# Patient Record
Sex: Male | Born: 1940 | Race: White | Hispanic: No | Marital: Married | Smoking: Former smoker
Health system: Southern US, Community
[De-identification: ages and names within clinical notes are randomized; demographics above are authoritative.]

## PROBLEM LIST (undated history)

## (undated) DIAGNOSIS — I4891 Unspecified atrial fibrillation: Secondary | ICD-10-CM

## (undated) DIAGNOSIS — M109 Gout, unspecified: Secondary | ICD-10-CM

## (undated) DIAGNOSIS — L409 Psoriasis, unspecified: Secondary | ICD-10-CM

## (undated) DIAGNOSIS — Z9989 Dependence on other enabling machines and devices: Secondary | ICD-10-CM

## (undated) DIAGNOSIS — D649 Anemia, unspecified: Secondary | ICD-10-CM

## (undated) DIAGNOSIS — N189 Chronic kidney disease, unspecified: Secondary | ICD-10-CM

## (undated) DIAGNOSIS — K56609 Unspecified intestinal obstruction, unspecified as to partial versus complete obstruction: Secondary | ICD-10-CM

## (undated) DIAGNOSIS — C189 Malignant neoplasm of colon, unspecified: Secondary | ICD-10-CM

## (undated) DIAGNOSIS — J449 Chronic obstructive pulmonary disease, unspecified: Secondary | ICD-10-CM

## (undated) DIAGNOSIS — I10 Essential (primary) hypertension: Secondary | ICD-10-CM

## (undated) DIAGNOSIS — Z9289 Personal history of other medical treatment: Secondary | ICD-10-CM

## (undated) DIAGNOSIS — C4490 Unspecified malignant neoplasm of skin, unspecified: Secondary | ICD-10-CM

## (undated) DIAGNOSIS — G4733 Obstructive sleep apnea (adult) (pediatric): Secondary | ICD-10-CM

## (undated) DIAGNOSIS — T7840XA Allergy, unspecified, initial encounter: Secondary | ICD-10-CM

## (undated) HISTORY — PX: BACK SURGERY: SHX140

## (undated) HISTORY — PX: INCISIONAL HERNIA REPAIR: SHX193

## (undated) HISTORY — PX: CATARACT EXTRACTION, BILATERAL: SHX1313

## (undated) HISTORY — PX: SHOULDER ARTHROSCOPY WITH ROTATOR CUFF REPAIR: SHX5685

## (undated) HISTORY — PX: JOINT REPLACEMENT: SHX530

## (undated) HISTORY — PX: SHOULDER ARTHROSCOPY: SHX128

## (undated) HISTORY — PX: VOCAL CORD INJECTION: SHX2663

## (undated) HISTORY — PX: HERNIA REPAIR: SHX51

## (undated) HISTORY — PX: SKIN CANCER EXCISION: SHX779

## (undated) HISTORY — PX: TONSILLECTOMY: SUR1361

## (undated) HISTORY — PX: VARICOSE VEIN SURGERY: SHX832

---

## 2001-10-01 HISTORY — PX: TOTAL HIP ARTHROPLASTY: SHX124

## 2004-07-01 HISTORY — PX: COLON SURGERY: SHX602

## 2011-10-02 HISTORY — PX: ATRIAL FIBRILLATION ABLATION: EP1191

## 2011-10-02 HISTORY — PX: ANTERIOR CERVICAL DECOMP/DISCECTOMY FUSION: SHX1161

## 2016-10-01 HISTORY — PX: MASS EXCISION: SHX2000

## 2017-04-23 ENCOUNTER — Inpatient Hospital Stay (HOSPITAL_COMMUNITY): Payer: Medicare Other

## 2017-04-23 ENCOUNTER — Emergency Department (HOSPITAL_COMMUNITY): Payer: Medicare Other

## 2017-04-23 ENCOUNTER — Inpatient Hospital Stay (HOSPITAL_COMMUNITY)
Admission: EM | Admit: 2017-04-23 | Discharge: 2017-04-25 | DRG: 390 | Disposition: A | Discharge status: Home / Self Care | Payer: Medicare Other | Attending: Internal Medicine | Admitting: Internal Medicine

## 2017-04-23 ENCOUNTER — Encounter (HOSPITAL_COMMUNITY): Payer: Self-pay | Admitting: Emergency Medicine

## 2017-04-23 DIAGNOSIS — J449 Chronic obstructive pulmonary disease, unspecified: Secondary | ICD-10-CM

## 2017-04-23 DIAGNOSIS — Z79899 Other long term (current) drug therapy: Secondary | ICD-10-CM

## 2017-04-23 DIAGNOSIS — I10 Essential (primary) hypertension: Secondary | ICD-10-CM

## 2017-04-23 DIAGNOSIS — Z85038 Personal history of other malignant neoplasm of large intestine: Secondary | ICD-10-CM

## 2017-04-23 DIAGNOSIS — Z9049 Acquired absence of other specified parts of digestive tract: Secondary | ICD-10-CM | POA: Diagnosis not present

## 2017-04-23 DIAGNOSIS — F1010 Alcohol abuse, uncomplicated: Secondary | ICD-10-CM | POA: Diagnosis present

## 2017-04-23 DIAGNOSIS — Z96642 Presence of left artificial hip joint: Secondary | ICD-10-CM | POA: Diagnosis present

## 2017-04-23 DIAGNOSIS — Z91040 Latex allergy status: Secondary | ICD-10-CM | POA: Diagnosis not present

## 2017-04-23 DIAGNOSIS — Z8042 Family history of malignant neoplasm of prostate: Secondary | ICD-10-CM

## 2017-04-23 DIAGNOSIS — I4891 Unspecified atrial fibrillation: Secondary | ICD-10-CM | POA: Diagnosis present

## 2017-04-23 DIAGNOSIS — Z825 Family history of asthma and other chronic lower respiratory diseases: Secondary | ICD-10-CM | POA: Diagnosis not present

## 2017-04-23 DIAGNOSIS — K56609 Unspecified intestinal obstruction, unspecified as to partial versus complete obstruction: Principal | ICD-10-CM | POA: Diagnosis present

## 2017-04-23 DIAGNOSIS — K56600 Partial intestinal obstruction, unspecified as to cause: Secondary | ICD-10-CM | POA: Diagnosis not present

## 2017-04-23 HISTORY — DX: Dependence on other enabling machines and devices: Z99.89

## 2017-04-23 HISTORY — DX: Unspecified atrial fibrillation: I48.91

## 2017-04-23 HISTORY — DX: Essential (primary) hypertension: I10

## 2017-04-23 HISTORY — DX: Psoriasis, unspecified: L40.9

## 2017-04-23 HISTORY — DX: Gout, unspecified: M10.9

## 2017-04-23 HISTORY — DX: Anemia, unspecified: D64.9

## 2017-04-23 HISTORY — DX: Allergy, unspecified, initial encounter: T78.40XA

## 2017-04-23 HISTORY — DX: Unspecified intestinal obstruction, unspecified as to partial versus complete obstruction: K56.609

## 2017-04-23 HISTORY — DX: Unspecified malignant neoplasm of skin, unspecified: C44.90

## 2017-04-23 HISTORY — DX: Chronic obstructive pulmonary disease, unspecified: J44.9

## 2017-04-23 HISTORY — DX: Chronic kidney disease, unspecified: N18.9

## 2017-04-23 HISTORY — DX: Malignant neoplasm of colon, unspecified: C18.9

## 2017-04-23 HISTORY — DX: Personal history of other medical treatment: Z92.89

## 2017-04-23 HISTORY — DX: Obstructive sleep apnea (adult) (pediatric): G47.33

## 2017-04-23 LAB — COMPREHENSIVE METABOLIC PANEL
ALT: 16 U/L — AB (ref 17–63)
AST: 24 U/L (ref 15–41)
Albumin: 3.9 g/dL (ref 3.5–5.0)
Alkaline Phosphatase: 83 U/L (ref 38–126)
Anion gap: 8 (ref 5–15)
BILIRUBIN TOTAL: 1.3 mg/dL — AB (ref 0.3–1.2)
BUN: 19 mg/dL (ref 6–20)
CALCIUM: 9.3 mg/dL (ref 8.9–10.3)
CO2: 27 mmol/L (ref 22–32)
CREATININE: 1.02 mg/dL (ref 0.61–1.24)
Chloride: 100 mmol/L — ABNORMAL LOW (ref 101–111)
Glucose, Bld: 121 mg/dL — ABNORMAL HIGH (ref 65–99)
Potassium: 4.6 mmol/L (ref 3.5–5.1)
Sodium: 135 mmol/L (ref 135–145)
TOTAL PROTEIN: 7 g/dL (ref 6.5–8.1)

## 2017-04-23 LAB — URINALYSIS, ROUTINE W REFLEX MICROSCOPIC
Bilirubin Urine: NEGATIVE
Glucose, UA: NEGATIVE mg/dL
Ketones, ur: 5 mg/dL — AB
LEUKOCYTES UA: NEGATIVE
NITRITE: NEGATIVE
Protein, ur: >=300 mg/dL — AB
SQUAMOUS EPITHELIAL / LPF: NONE SEEN
Specific Gravity, Urine: 1.02 (ref 1.005–1.030)
pH: 5 (ref 5.0–8.0)

## 2017-04-23 LAB — CBC
HCT: 43 % (ref 39.0–52.0)
Hemoglobin: 15.1 g/dL (ref 13.0–17.0)
MCH: 33.8 pg (ref 26.0–34.0)
MCHC: 35.1 g/dL (ref 30.0–36.0)
MCV: 96.2 fL (ref 78.0–100.0)
PLATELETS: 166 10*3/uL (ref 150–400)
RBC: 4.47 MIL/uL (ref 4.22–5.81)
RDW: 13.9 % (ref 11.5–15.5)
WBC: 6.8 10*3/uL (ref 4.0–10.5)

## 2017-04-23 LAB — LIPASE, BLOOD: Lipase: 37 U/L (ref 11–51)

## 2017-04-23 LAB — GLUCOSE, CAPILLARY: GLUCOSE-CAPILLARY: 92 mg/dL (ref 65–99)

## 2017-04-23 MED ORDER — FOLIC ACID 1 MG PO TABS
1.0000 mg | ORAL_TABLET | Freq: Every day | ORAL | Status: DC
  Administered 2017-04-25: 1 mg via ORAL
  Filled 2017-04-23: qty 1

## 2017-04-23 MED ORDER — HYDROMORPHONE HCL 1 MG/ML IJ SOLN
1.0000 mg | INTRAMUSCULAR | Status: DC | PRN
  Administered 2017-04-23 – 2017-04-24 (×2): 1 mg via INTRAVENOUS
  Filled 2017-04-23 (×2): qty 1

## 2017-04-23 MED ORDER — VITAMIN B-1 100 MG PO TABS
100.0000 mg | ORAL_TABLET | Freq: Every day | ORAL | Status: DC
  Administered 2017-04-25: 100 mg via ORAL
  Filled 2017-04-23: qty 1

## 2017-04-23 MED ORDER — ADULT MULTIVITAMIN W/MINERALS CH
1.0000 | ORAL_TABLET | Freq: Every day | ORAL | Status: DC
  Administered 2017-04-25: 1 via ORAL
  Filled 2017-04-23: qty 1

## 2017-04-23 MED ORDER — HYDROMORPHONE HCL 1 MG/ML IJ SOLN
1.0000 mg | Freq: Once | INTRAMUSCULAR | Status: AC
  Administered 2017-04-23: 1 mg via INTRAVENOUS
  Filled 2017-04-23: qty 1

## 2017-04-23 MED ORDER — ONDANSETRON HCL 4 MG/2ML IJ SOLN
4.0000 mg | Freq: Three times a day (TID) | INTRAMUSCULAR | Status: DC | PRN
  Administered 2017-04-23: 4 mg via INTRAVENOUS
  Filled 2017-04-23: qty 2

## 2017-04-23 MED ORDER — BETAMETHASONE DIPROPIONATE 0.05 % EX LOTN: 1.0000 "application " | TOPICAL_LOTION | CUTANEOUS | Status: DC

## 2017-04-23 MED ORDER — ONDANSETRON HCL 4 MG/2ML IJ SOLN
4.0000 mg | Freq: Once | INTRAMUSCULAR | Status: AC
  Administered 2017-04-23: 4 mg via INTRAVENOUS
  Filled 2017-04-23: qty 2

## 2017-04-23 MED ORDER — THIAMINE HCL 100 MG/ML IJ SOLN
100.0000 mg | Freq: Every day | INTRAMUSCULAR | Status: DC
  Administered 2017-04-23 – 2017-04-24 (×2): 100 mg via INTRAVENOUS
  Filled 2017-04-23 (×2): qty 2

## 2017-04-23 MED ORDER — SODIUM CHLORIDE 0.9 % IV BOLUS (SEPSIS)
1000.0000 mL | Freq: Once | INTRAVENOUS | Status: AC
  Administered 2017-04-23: 1000 mL via INTRAVENOUS

## 2017-04-23 MED ORDER — LORAZEPAM 1 MG PO TABS
1.0000 mg | ORAL_TABLET | Freq: Four times a day (QID) | ORAL | Status: DC | PRN
Start: 2017-04-23 — End: 2017-04-25

## 2017-04-23 MED ORDER — LIDOCAINE VISCOUS 2 % MT SOLN
15.0000 mL | Freq: Once | OROMUCOSAL | Status: AC
  Administered 2017-04-23: 15 mL via OROMUCOSAL
  Filled 2017-04-23: qty 15

## 2017-04-23 MED ORDER — ALBUTEROL SULFATE (2.5 MG/3ML) 0.083% IN NEBU: 2.5000 mg | INHALATION_SOLUTION | Freq: Four times a day (QID) | RESPIRATORY_TRACT | Status: DC | PRN

## 2017-04-23 MED ORDER — HEPARIN SODIUM (PORCINE) 5000 UNIT/ML IJ SOLN
5000.0000 [IU] | Freq: Three times a day (TID) | INTRAMUSCULAR | Status: DC
  Administered 2017-04-23 – 2017-04-25 (×5): 5000 [IU] via SUBCUTANEOUS
  Filled 2017-04-23 (×5): qty 1

## 2017-04-23 MED ORDER — DIATRIZOATE MEGLUMINE & SODIUM 66-10 % PO SOLN
90.0000 mL | Freq: Once | ORAL | Status: AC
  Administered 2017-04-24: 90 mL via NASOGASTRIC
  Filled 2017-04-23 (×2): qty 90

## 2017-04-23 MED ORDER — IOPAMIDOL (ISOVUE-300) INJECTION 61%
INTRAVENOUS | Status: AC
  Filled 2017-04-23: qty 100

## 2017-04-23 MED ORDER — NAPHAZOLINE-GLYCERIN 0.012-0.2 % OP SOLN
1.0000 [drp] | Freq: Four times a day (QID) | OPHTHALMIC | Status: DC | PRN
  Filled 2017-04-23: qty 15

## 2017-04-23 MED ORDER — LORAZEPAM 2 MG/ML IJ SOLN: 1.0000 mg | Freq: Four times a day (QID) | INTRAMUSCULAR | Status: DC | PRN

## 2017-04-23 MED ORDER — SODIUM CHLORIDE 0.9 % IV SOLN
INTRAVENOUS | Status: DC
  Administered 2017-04-23 – 2017-04-25 (×4): via INTRAVENOUS

## 2017-04-23 NOTE — ED Notes (Signed)
Pt in room resting comfortable. Denies pain at this time. NG hooked to LIS. Call bell in reach

## 2017-04-23 NOTE — ED Triage Notes (Signed)
Pt. Stated, Im having bad stomach pain and its probably because I have a small bowel obstruction.  I have scar tissue and it brings it on. The pain started around 230 this morning.

## 2017-04-23 NOTE — ED Provider Notes (Addendum)
[facility] DEPT Provider Note   CSN: [id number] Arrival date & time: 04/23/17  7616     History   Chief Complaint Chief Complaint  Patient presents with  . Abdominal Pain    HPI Seth Soto is a 76 y.o. male.  HPI Patient presents emergency department with abdominal pain that began around 2:30 this morning. Reports nausea without vomiting.  Patient has a history of multiple abdominal surgeries and history of small bowel and partial small bowel obstructions before in the past.  He reports some abdominal swelling.  He reports left lower abdominal pain.  Pain is moderate in severity.  It's coming in waves.  No urinary complaints.  No fevers or chills  Past Medical History:  Diagnosis Date  . COPD (chronic obstructive pulmonary disease) ([place])   . Hypertension   . Small bowel obstruction (HCC)     There are no active problems to display for this patient.   History reviewed. No pertinent surgical history.     Home Medications    Prior to Admission medications   Medication Sig Start Date End Date Taking? Authorizing Provider  albuterol (PROVENTIL HFA;VENTOLIN HFA) 108 (90 Base) MCG/ACT inhaler Inhale 1 puff into the lungs every 6 (six) hours as needed for wheezing or shortness of breath.   Yes [provider]  Apremilast (OTEZLA) 30 MG TABS Take 30 mg by mouth 2 (two) times daily.   Yes [provider]  betamethasone dipropionate 0.05 % lotion Apply 1 application topically 3 (three) times a week.   Yes [provider]  metoprolol tartrate (LOPRESSOR) 25 MG tablet Take 25 mg by mouth 2 (two) times daily.   Yes [provider]  tetrahydrozoline-zinc (VISINE-AC) 0.05-0.25 % ophthalmic solution Place 3-4 drops into both eyes every morning.   Yes [provider]    Family History No family history on file.  Social History Social History  Substance Use Topics  . Smoking status: Never Smoker  . Smokeless tobacco: Never Used  .  Alcohol use Yes     Allergies   Latex   Review of Systems Review of Systems  All other systems reviewed and are negative.    Physical Exam Updated Vital Signs BP (!) 145/87   Pulse 69   Temp 98 F (36.7 C)   Resp 16   Ht 5\' 11"  (1.803 m)   Wt 111.1 kg (245 lb)   SpO2 96%   BMI 34.17 kg/m   Physical Exam  Constitutional: He is oriented to person, place, and time. He appears well-developed and well-nourished.  HENT:  Head: Normocephalic and atraumatic.  Eyes: EOM are normal.  Neck: Normal range of motion.  Cardiovascular: Normal rate, regular rhythm, normal heart sounds and intact distal pulses.   Pulmonary/Chest: Effort normal and breath sounds normal. No respiratory distress.  Abdominal: Soft. He exhibits no distension.  Left-sided abdominal tenderness without guarding or rebound.  Musculoskeletal: Normal range of motion.  Neurological: He is alert and oriented to person, place, and time.  Skin: Skin is warm and dry.  Psychiatric: He has a normal mood and affect. Judgment normal.  Nursing note and vitals reviewed.    ED Treatments / Results  Labs (all labs ordered are listed, but only abnormal results are displayed) Labs Reviewed  COMPREHENSIVE METABOLIC PANEL - Abnormal; Notable for the following:       Result Value   Chloride 100 (*)    Glucose, Bld 121 (*)    ALT 16 (*)  Total Bilirubin 1.3 (*)    All other components within normal limits  URINALYSIS, ROUTINE W REFLEX MICROSCOPIC - Abnormal; Notable for the following:    Hgb urine dipstick SMALL (*)    Ketones, ur 5 (*)    Protein, ur >=300 (*)    Bacteria, UA RARE (*)    All other components within normal limits  LIPASE, BLOOD  CBC    EKG  EKG Interpretation None       Radiology Ct Abdomen Pelvis Wo Contrast  Result Date: 04/23/2017 CLINICAL DATA:  Abdominal pain. Personal history small bowel obstruction. EXAM: CT ABDOMEN AND PELVIS WITHOUT CONTRAST TECHNIQUE: Multidetector CT imaging  of the abdomen and pelvis was performed following the standard protocol without IV contrast. COMPARISON:  None. FINDINGS: Lower chest: Mild dependent atelectasis is present bilaterally. No focal nodule, mass, or airspace disease is present. Coronary artery calcifications are present. The the heart is otherwise within normal limits. No significant pleural or pericardial effusion is present. Hepatobiliary: A 1.4 cm fluid density lesion is present within the left lobe of the liver anteriorly. No other focal hepatic lesions are present. The common bile duct is within normal limits. Fluid layering in the gallbladder likely represents sludge. Pancreas: Unremarkable. No pancreatic ductal dilatation or surrounding inflammatory changes. Spleen: Normal in size without focal abnormality. 2 splenules are noted. Adrenals/Urinary Tract: Calcification is noted in the right adrenal gland. The adrenal glands are otherwise within normal limits bilaterally. A 3 cm water density exophytic lesion of the right kidney likely represents a simple cyst. A 2 cm posteromedial exophytic low-density lesion in the left kidney likely represents a simple cyst. A 9 mm indeterminate lesion is present near the upper pole of the left kidney. No stone or hydronephrosis is present in either kidney. The ureters are within normal limits. The urinary bladder is unremarkable. Stomach/Bowel: The stomach and duodenum are within normal limits, decompressed. Multiple small bowel adhesions are present. Anterior loops in the right mid and lower abdomen are mildly stenting, measured up to 3.5 cm. Fluid levels are present. No discrete transition segment is evident. Diverticular changes are present in the descending colon without focal inflammation to suggest diverticulitis. Right hemicolectomy is noted. The anastomosis is intact. There is some stranding about the small bowel. No free fluid or free air is present. Vascular/Lymphatic: Atherosclerotic calcifications  are present in the aorta without aneurysm. There is no significant adenopathy. Reproductive: Prostate is unremarkable. Other: No significant free fluid or free air is present. Musculoskeletal: Levoconvex curvature of the lumbar spine is noted. Asymmetric endplate changes are present on the right. AP alignment is anatomic. No focal lytic or blastic lesions are present. Degenerative changes are noted in the SI joints bilaterally. Left total hip arthroplasty is present. The right hip is within normal limits. IMPRESSION: 1. Distended loops of small bowel in the right mid and lower abdomen with fluid levels suggesting ileus or partial small bowel obstruction. 2. Inflammatory changes near the ileocolonic anastomosis may reflect a transition point. 3. Right hemicolectomy. 4. Inflammatory changes about both kidneys without significant stone or obstruction. 5. Bilateral exophytic renal lesions appear to be cystic. Electronically Signed   By: San Morelle M.D.   On: 04/23/2017 12:33    Procedures Procedures (including critical care time)  Medications Ordered in ED Medications  HYDROmorphone (DILAUDID) injection 1 mg (1 mg Intravenous Given 04/23/17 1153)  ondansetron (ZOFRAN) injection 4 mg (4 mg Intravenous Given 04/23/17 1153)  sodium chloride 0.9 % bolus 1,000 mL (0  mLs Intravenous Stopped 04/23/17 1257)  lidocaine (XYLOCAINE) 2 % viscous mouth solution 15 mL (15 mLs Mouth/Throat Given 04/23/17 1154)     Initial Impression / Assessment and Plan / ED Course  I have reviewed the triage vital signs and the nursing notes.  Pertinent labs & imaging results that were available during my care of the patient were reviewed by me and considered in my medical decision making (see chart for details).     Patient tolerated NG well.  CT demonstrates partial small bowel obstruction.  Patient be admitted for symptomatically management.  I suspect this will resolve on his own similar to his other episodes.   NG  TUBE placed by nursing staff  gen surg consultation Internal medicine   Final Clinical Impressions(s) / ED Diagnoses   Final diagnoses:  None    New Prescriptions New Prescriptions   No medications on file     Jola Schmidt, MD 04/23/17 [place], MD 05/09/17 [phone]

## 2017-04-23 NOTE — Consult Note (Signed)
[facility] Consult/Admission Note  Seth Soto Oct 02, 1940  [id number].    Requesting MD: Dr. Venora Maples Chief Complaint/Reason for Consult: SBO  HPI:  Patient is a 76 year old male with a history of colon cancer S/P partial colectomy complicated by sepsis and wound dehiscence, incisional hernia repair, chest mass S/P VATS, history of HTN, COPD on CPAP who presented to the [facility] ED with complaints of abdominal pain since 2 AM this morning. Patient states he woke with mild right lower quadrant abdominal pain that progressively worsened to severe pain, nonradiating, crampy in nature with associated bloating. Patient's last bowel movement was yesterday morning. Patient having flatus in the ED. Patient denies nausea, vomiting, chest pain, shortness of breath, dysuria, hematochezia, fever or chills. Patient is in town visiting his daughter from [place]. He states some of his surgeries were at [facility]. Patient states he has had a few bowel obstructions in the past that feels similar and were treated nonoperatively. Patient is not on blood thinners. Has a history of A. fib S/P ablation now in normal sinus rhythm.  Work up in the ED shows he is afebrile, BP is up some. T bilirubin 1.3, glucose 121, Cl 100, CBC is normal.   CT scan shows distended loops of bowel right and lower abdomen, suggesting ileus vs SBO.  ROS:  Review of Systems  Constitutional: Negative for chills, diaphoresis and fever.  Respiratory: Negative for shortness of breath.   Cardiovascular: Negative for chest pain.  Gastrointestinal: Positive for abdominal pain. Negative for blood in stool, constipation, diarrhea, nausea and vomiting.  Genitourinary: Negative for dysuria and hematuria.  Skin: Negative for rash.  Neurological: Negative for dizziness and loss of consciousness.  All other systems reviewed and are negative.    No family history on file.  Past Medical History:  Diagnosis Date  .  COPD (chronic obstructive pulmonary disease) ([place])   . Hypertension   . Small bowel obstruction ([place])     History reviewed. No pertinent surgical history.  Social History:  reports that he has never smoked. He has never used smokeless tobacco. He reports that he drinks alcohol. He reports that he does not use drugs.  Allergies:  Allergies  Allergen Reactions  . Latex Other (See Comments)    "peels skin off"     (Not in a hospital admission)  Blood pressure (!) 172/105, pulse 76, temperature 97.9 F (36.6 C), temperature source Oral, resp. rate 16, height _0  (1.803 m), weight 245 lb (111.1 kg), SpO2 94 %.  Physical Exam  Constitutional: He is oriented to person, place, and time and well-developed, well-nourished, and in no distress. No distress.  Well-appearing  HENT:  Head: Normocephalic and atraumatic.  Nose: Nose normal.  Eyes: Right eye exhibits no discharge. Left eye exhibits no discharge. No scleral icterus.  Pupils are equal, bilateral conjunctiva mildly injected  Neck: Normal range of motion. Neck supple. No tracheal deviation present. No thyromegaly present.  Cardiovascular: Normal rate, regular rhythm, normal heart sounds and intact distal pulses.  Exam reveals no gallop and no friction rub.   No murmur heard. Pulses:      Radial pulses are 2+ on the right side, and 2+ on the left side.       Dorsalis pedis pulses are 2+ on the right side, and 2+ on the left side.  Pulmonary/Chest: Effort normal and breath sounds normal. No respiratory distress. He has no wheezes. He has no rhonchi. He has no rales.  3 well healed previous incisions noted to right lateral chest wall patient noted was from VATS procedure  Abdominal: Soft. He exhibits distension. Bowel sounds are tinkling. There is generalized tenderness and tenderness in the right lower quadrant. There is no rigidity and no guarding. No hernia.    Large well-healed transverse scar noted to abdomen just above  the umbilicus, moderately distended, generalized TTP worse in the right lower quadrant  Musculoskeletal: Normal range of motion. He exhibits no edema, tenderness or deformity.  Lymphadenopathy:    He has no cervical adenopathy.  Neurological: He is alert and oriented to person, place, and time. No cranial nerve deficit (grossly intact). GCS score is 15.  Skin: Skin is warm and dry. No rash noted. He is not diaphoretic.  Psychiatric: Mood and affect normal.  Nursing note and vitals reviewed.   Results for orders placed or performed during the hospital encounter of 04/23/17 (from the past 48 hour(s))  Lipase, blood     Status: None   Collection Time: 04/23/17 10:02 AM  Result Value Ref Range   Lipase 37 11 - 51 U/L  Comprehensive metabolic panel     Status: Abnormal   Collection Time: 04/23/17 10:02 AM  Result Value Ref Range   Sodium 135 135 - 145 mmol/L   Potassium 4.6 3.5 - 5.1 mmol/L   Chloride 100 (L) 101 - 111 mmol/L   CO2 27 22 - 32 mmol/L   Glucose, Bld 121 (H) 65 - 99 mg/dL   BUN 19 6 - 20 mg/dL   Creatinine, Ser 1.02 0.61 - 1.24 mg/dL   Calcium 9.3 8.9 - 10.3 mg/dL   Total Protein 7.0 6.5 - 8.1 g/dL   Albumin 3.9 3.5 - 5.0 g/dL   AST 24 15 - 41 U/L   ALT 16 (L) 17 - 63 U/L   Alkaline Phosphatase 83 38 - 126 U/L   Total Bilirubin 1.3 (H) 0.3 - 1.2 mg/dL   GFR calc non Af Amer >60 >60 mL/min   GFR calc Af Amer >60 >60 mL/min    Comment: (NOTE) The eGFR has been calculated using the CKD EPI equation. This calculation has not been validated in all clinical situations. eGFR's persistently <60 mL/min signify possible Chronic Kidney Disease.    Anion gap 8 5 - 15  CBC     Status: None   Collection Time: 04/23/17 10:02 AM  Result Value Ref Range   WBC 6.8 4.0 - 10.5 K/uL   RBC 4.47 4.22 - 5.81 MIL/uL   Hemoglobin 15.1 13.0 - 17.0 g/dL   HCT 43.0 39.0 - 52.0 %   MCV 96.2 78.0 - 100.0 fL   MCH 33.8 26.0 - 34.0 pg   MCHC 35.1 30.0 - 36.0 g/dL   RDW 13.9 11.5 - 15.5 %     Platelets 166 150 - 400 K/uL  Urinalysis, Routine w reflex microscopic     Status: Abnormal   Collection Time: 04/23/17 10:02 AM  Result Value Ref Range   Color, Urine YELLOW YELLOW   APPearance CLEAR CLEAR   Specific Gravity, Urine 1.020 1.005 - 1.030   pH 5.0 5.0 - 8.0   Glucose, UA NEGATIVE NEGATIVE mg/dL   Hgb urine dipstick SMALL (A) NEGATIVE   Bilirubin Urine NEGATIVE NEGATIVE   Ketones, ur 5 (A) NEGATIVE mg/dL   Protein, ur >=300 (A) NEGATIVE mg/dL   Nitrite NEGATIVE NEGATIVE   Leukocytes, UA NEGATIVE NEGATIVE   RBC / HPF 0-5 0 - 5 RBC/hpf  WBC, UA 0-5 0 - 5 WBC/hpf   Bacteria, UA RARE (A) NONE SEEN   Squamous Epithelial / LPF NONE SEEN NONE SEEN   Mucous PRESENT    Hyaline Casts, UA PRESENT    Ct Abdomen Pelvis Wo Contrast  Result Date: 04/23/2017 CLINICAL DATA:  Abdominal pain. Personal history small bowel obstruction. EXAM: CT ABDOMEN AND PELVIS WITHOUT CONTRAST TECHNIQUE: Multidetector CT imaging of the abdomen and pelvis was performed following the standard protocol without IV contrast. COMPARISON:  None. FINDINGS: Lower chest: Mild dependent atelectasis is present bilaterally. No focal nodule, mass, or airspace disease is present. Coronary artery calcifications are present. The the heart is otherwise within normal limits. No significant pleural or pericardial effusion is present. Hepatobiliary: A 1.4 cm fluid density lesion is present within the left lobe of the liver anteriorly. No other focal hepatic lesions are present. The common bile duct is within normal limits. Fluid layering in the gallbladder likely represents sludge. Pancreas: Unremarkable. No pancreatic ductal dilatation or surrounding inflammatory changes. Spleen: Normal in size without focal abnormality. 2 splenules are noted. Adrenals/Urinary Tract: Calcification is noted in the right adrenal gland. The adrenal glands are otherwise within normal limits bilaterally. A 3 cm water density exophytic lesion of the  right kidney likely represents a simple cyst. A 2 cm posteromedial exophytic low-density lesion in the left kidney likely represents a simple cyst. A 9 mm indeterminate lesion is present near the upper pole of the left kidney. No stone or hydronephrosis is present in either kidney. The ureters are within normal limits. The urinary bladder is unremarkable. Stomach/Bowel: The stomach and duodenum are within normal limits, decompressed. Multiple small bowel adhesions are present. Anterior loops in the right mid and lower abdomen are mildly stenting, measured up to 3.5 cm. Fluid levels are present. No discrete transition segment is evident. Diverticular changes are present in the descending colon without focal inflammation to suggest diverticulitis. Right hemicolectomy is noted. The anastomosis is intact. There is some stranding about the small bowel. No free fluid or free air is present. Vascular/Lymphatic: Atherosclerotic calcifications are present in the aorta without aneurysm. There is no significant adenopathy. Reproductive: Prostate is unremarkable. Other: No significant free fluid or free air is present. Musculoskeletal: Levoconvex curvature of the lumbar spine is noted. Asymmetric endplate changes are present on the right. AP alignment is anatomic. No focal lytic or blastic lesions are present. Degenerative changes are noted in the SI joints bilaterally. Left total hip arthroplasty is present. The right hip is within normal limits. IMPRESSION: 1. Distended loops of small bowel in the right mid and lower abdomen with fluid levels suggesting ileus or partial small bowel obstruction. 2. Inflammatory changes near the ileocolonic anastomosis may reflect a transition point. 3. Right hemicolectomy. 4. Inflammatory changes about both kidneys without significant stone or obstruction. 5. Bilateral exophytic renal lesions appear to be cystic. Electronically Signed   By: San Morelle M.D.   On: 04/23/2017 12:33    Dg Abd Portable 1 View  Result Date: 04/23/2017 CLINICAL DATA:  Nasogastric tube placement, abdominal distension, small-bowel obstruction EXAM: PORTABLE ABDOMEN - 1 VIEW COMPARISON:  CT abdomen and pelvis 04/23/2017 FINDINGS: Tip of endotracheal tube projects over stomach though the proximal side-port projects over the distal esophagus, recommend advancing tube 7 cm. Lung bases clear. Visualized bowel gas pattern normal. IMPRESSION: Recommend advancing nasogastric tube 7 cm to place the proximal side-port within the stomach. Electronically Signed   By: Lavonia Dana M.D.   On: 04/23/2017  13:22      Assessment/Plan  COPD HTN History of colon cancer History of A. fib now in NSR  History of multiple abdominal surgeries including partial colectomy SBO versus ileus - Patient being admitted to medicine, appreciate their assistance with this patient - Will initiate SBO protocol and Gastrografin to be given tomorrow morning - Continue NG tube, nothing by mouth, IV fluids  We will continue to follow this patient. Thank you for the consult  Kalman Drape, [facility] Surgery 04/23/2017, 2:12 PM Pager: [phone] Consults: [phone] Mon-Fri 7:00 am-4:30 pm Sat-Sun 7:00 am-11:30 am

## 2017-04-23 NOTE — ED Notes (Signed)
X-ray at bedside

## 2017-04-23 NOTE — ED Notes (Signed)
Patient transported to CT 

## 2017-04-23 NOTE — ED Notes (Signed)
Admitting at bedside; NG tube inserted 7 cm further per xray recommendations. Pt tolerated well.

## 2017-04-23 NOTE — Progress Notes (Signed)
Pt new admit from ED for abdominal pain/ SBO partial with left NGT connected to wall intermittent suction, alert and oriented, ambulatory, no complain of abdominal pain at this time.

## 2017-04-23 NOTE — H&P (Signed)
Date: 04/23/2017               Patient Name:  Seth Soto MRN: [id number]  DOB: Sep 13, 1941 Age / Sex: 76 y.o., male   PCP: Patient, No Pcp Per         Medical Service: Internal Medicine Teaching Service         Attending Physician: Dr. Aldine Contes, MD    First Contact: Dr. Berline Lopes Pager: [phone]  Second Contact: Dr. Marlowe Sax Pager: [phone]       After Hours (After 5p/  First Contact Pager: [phone]  weekends / holidays): Second Contact Pager: [phone]   Chief Complaint: "I have a small bowel obstruction"  History of Present Illness: Seth Soto is a pleasant 76 year old male who presented to the ED with nausea, vomiting, colicky abdominal pain and one episode of loose watery stool. He was a past medical history significant for HTN, COPD, psoriasis, atrial fibrillation, colon cancer s/p surgical resection in October 4097 complicated by sepsis, multiple incisional hernia repairs, benign chest mass s/p removal, two previous small bowel obstructions (SBO) and EtOH abuse. He was accompanied by his wife and daughter who were present for the exam and contributed to the history.   He stated that beginning around 2:00 this morning he was awakened by a dull colicky abdominal pain with sufficient relief between episodes. The pain did not radiate, nor was it alleviated by position. There was progression of the pain with increasing length of the episodes, as well as alteration from dull to sharp character of the pain. In addition there was one abnormal loose watery stool the previous evening but minus blood, mucous, overly odorous or floating particles. The patient has experienced two similar episodes of SBO requiring hospitalization. The first required a nasogastric tube for decompression of a complete obstruction while the second was reported to the patient as being a partial obstruction. He believed this episode to have been reported as a partial obstruction as well. 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 Patient admitted to flatulence on two occassions but still experiencing considerably more abdominal distention than usual.   The patient denied, fever, chills, weight loss, headache, chest pain, muscle aches, constipation, long term diarrhea, shortness of breath, palpitations, urinary frequency, dysuria, hematemesis, melena, vision changes, or difficulty ambulating prior to the event.   ED placed NG tube, completed CT/Xray of the abdomin, consulted surgery, given 1 liter normal saline bolus, administered dilaudid 1mg  and ondansetron PRN. Consulted IMTS for admission and treatment of his SBO management.   Meds:  Current Meds  Medication Sig  . albuterol (PROVENTIL HFA;VENTOLIN HFA) 108 (90 Base) MCG/ACT inhaler Inhale 1 puff into the lungs every 6 (six) hours as needed for wheezing or shortness of breath.  . Apremilast (OTEZLA) 30 MG TABS Take 30 mg by mouth 2 (two) times daily.  . betamethasone dipropionate 0.05 % lotion Apply 1 application topically 3 (three) times a week.  . metoprolol tartrate (LOPRESSOR) 25 MG tablet Take 25 mg by mouth 2 (two) times daily.  Marland Kitchen tetrahydrozoline-zinc (VISINE-AC) 0.05-0.25 % ophthalmic solution Place 3-4 drops into both eyes every morning.     Allergies: Allergies as of 04/23/2017 - Review Complete 04/23/2017  Allergen Reaction Noted  . Latex Other (See Comments) 04/23/2017   Past Medical History:  Diagnosis Date  . Allergy    Latex and surgical tape?  Marland Kitchen  Atrial fibrillation ([place])   . COPD (chronic obstructive pulmonary disease) ([place])   . Hypertension   . Small bowel obstruction ([place])     Family History:  Mother, emphysema Father, prostate cancer  Social History:  Married Works as a Forensic psychologist One daughter and a son Smoker, denied EtOH, >3 cocktails per night Denied illegal drugs   Review of Systems: A complete ROS was  negative except as per HPI.   Physical Exam: Blood pressure (!) 172/105, pulse 76, temperature 97.9 F (36.6 C), temperature source Oral, resp. rate 16, height 5\' 11"  (1.803 m), weight 245 lb (111.1 kg), SpO2 94 %. Physical Exam  Constitutional: He is oriented to person, place, and time. He appears well-developed and well-nourished. No distress.  HENT:  Head: Normocephalic and atraumatic.  Eyes: EOM are normal. No scleral icterus.  Conjunctivae mildly erythematous   Neck: Normal range of motion. Neck supple. No tracheal deviation present. No thyromegaly present.  Cardiovascular: Normal rate and regular rhythm.   No murmur heard. Pulmonary/Chest: Effort normal and breath sounds normal. No respiratory distress. He has no wheezes.  Abdominal: Bowel sounds are normal. He exhibits distension. He exhibits no mass. There is tenderness. There is no rebound and no guarding.  Musculoskeletal: He exhibits no edema or deformity.  Neurological: He is alert and oriented to person, place, and time.  Skin: Skin is warm and dry. Capillary refill takes less than 2 seconds. He is not diaphoretic.  Psychiatric: He has a normal mood and affect. His behavior is normal. Judgment and thought content normal.   EKG: n/a  Abd-XR/CT:  Mildy distend loops of small bowel, NG tube visible, no free air  Assessment & Plan by Problem: Active Problems:   SBO (small bowel obstruction) (HCC)   COPD (chronic obstructive pulmonary disease) (HCC)   Essential hypertension   Atrial fibrillation (HCC)  Small bowel obstruction: Patient presented with sudden onset colicky abdominal pain with tenderness in the RLQ near the umbilicus. He has a history significant for two prior SBO's with imaging also consistent with possible SBO minus other prominent contributory process. The patient is most likely suffering from SBO s/p abdominal surgery, with a differential including pancreatitis, mesenteric ischemia, strangulated loop of bowel  s/p hernia,  Gastritis. Will admit for further evaluation and treatment as probable SBO at this time. -Surgery consulted to follow ordered SBO protocol and gastrografin in am -Lipase 37 -CMP unremarkable with potassium 4.6, bili 1.3, AST 24/ALT 16 -CBC unremarkable -UA demonstrated >300 protein, ketones, small Hgb -CT demonstrated possible SBO with distended loops of bowel, inflammation near the ileocolonic anastomosis, right hemicolectomy, exophytic renal lesions with parenchymal inflammation of the kidneys minus stones -NG tube placed for decompression.  -Dilaudid 1mg  q4prn for pain -zofran 4mg  q8prn for nauseas/vomiting  COPD: -Albuterol inhaler 1 puff q6 -Patients SpO2 94% denying shortness of breath -Denied sputum production, respiratory distress -Will continue to monitor -Please notify physician if SpO2 drops to 88% on room air or if patient requires O2 supplementation  Essential Hypertension: -will continue to monitor, if pressures consistently greater than 657 systolic page MD -currently holding home dose of metoprolol 25mg  BID  Atrial fibrillation -s/p ablation without known reoccurrance -notify physician if patient develops lightheadedness, palpitations or similar concerns  EtOH abuse: -CIWA protocol with ativan 1mg  ordered  Diet: NPO DVT ppx: heparin injection 5000 units subcutaneous q8hrs Fluids: 125cc/hr CODE: full Dispo: Admit patient to Inpatient with expected length of stay greater than 2 midnights.  Signed: Kathi Ludwig,  MD 04/23/2017, 2:41 PM  Pager: Pager# [phone]

## 2017-04-23 NOTE — ED Notes (Signed)
Pharm called for gastrografin

## 2017-04-24 ENCOUNTER — Inpatient Hospital Stay (HOSPITAL_COMMUNITY): Payer: Medicare Other

## 2017-04-24 DIAGNOSIS — I10 Essential (primary) hypertension: Secondary | ICD-10-CM

## 2017-04-24 DIAGNOSIS — F1010 Alcohol abuse, uncomplicated: Secondary | ICD-10-CM

## 2017-04-24 DIAGNOSIS — K56600 Partial intestinal obstruction, unspecified as to cause: Secondary | ICD-10-CM

## 2017-04-24 DIAGNOSIS — J449 Chronic obstructive pulmonary disease, unspecified: Secondary | ICD-10-CM

## 2017-04-24 DIAGNOSIS — Z91040 Latex allergy status: Secondary | ICD-10-CM

## 2017-04-24 LAB — BASIC METABOLIC PANEL
ANION GAP: 10 (ref 5–15)
BUN: 15 mg/dL (ref 6–20)
CO2: 24 mmol/L (ref 22–32)
Calcium: 8.6 mg/dL — ABNORMAL LOW (ref 8.9–10.3)
Chloride: 103 mmol/L (ref 101–111)
Creatinine, Ser: 1.01 mg/dL (ref 0.61–1.24)
GFR calc non Af Amer: >60 mL/min (ref >60)
GLUCOSE: 99 mg/dL (ref 65–99)
POTASSIUM: 4.4 mmol/L (ref 3.5–5.1)
Sodium: 137 mmol/L (ref 135–145)

## 2017-04-24 LAB — CBC
HEMATOCRIT: 43.2 % (ref 39.0–52.0)
HEMOGLOBIN: 14.4 g/dL (ref 13.0–17.0)
MCH: 32.8 pg (ref 26.0–34.0)
MCHC: 33.3 g/dL (ref 30.0–36.0)
MCV: 98.4 fL (ref 78.0–100.0)
Platelets: 161 10*3/uL (ref 150–400)
RBC: 4.39 MIL/uL (ref 4.22–5.81)
RDW: 14.2 % (ref 11.5–15.5)
WBC: 8.3 10*3/uL (ref 4.0–10.5)

## 2017-04-24 LAB — GLUCOSE, CAPILLARY
GLUCOSE-CAPILLARY: 95 mg/dL (ref 65–99)
GLUCOSE-CAPILLARY: 86 mg/dL (ref 65–99)
GLUCOSE-CAPILLARY: 114 mg/dL — AB (ref 65–99)
Glucose-Capillary: 88 mg/dL (ref 65–99)

## 2017-04-24 MED ORDER — METOPROLOL TARTRATE 25 MG PO TABS
25.0000 mg | ORAL_TABLET | Freq: Two times a day (BID) | ORAL | Status: DC
  Administered 2017-04-24 – 2017-04-25 (×3): 25 mg via ORAL
  Filled 2017-04-24 (×3): qty 1

## 2017-04-24 MED ORDER — ORAL CARE MOUTH RINSE
15.0000 mL | Freq: Two times a day (BID) | OROMUCOSAL | Status: DC
  Administered 2017-04-24 – 2017-04-25 (×3): 15 mL via OROMUCOSAL

## 2017-04-24 NOTE — Progress Notes (Signed)
NG TUBE REMOVED PER MD. PT TOLERATED IT WELL.

## 2017-04-24 NOTE — Progress Notes (Signed)
   Subjective: [name] was feeling great today. He stated that he slept rather well considering the NG tube, had two large bowel movements and continued to pass gas with reduced abdominal distention and pain. He denied nausea, vomiting, diarhea, constipation, chest pain, muscle aches, fever or chills.   Objective:  Vital signs in last 24 hours: Vitals:   04/23/17 1739 04/23/17 1744 04/23/17 2142 04/24/17 0528  BP: (!) 169/86 (!) 151/84 (!) 126/95 (!) 152/66  Pulse: 81 76 96 100  Resp: 16  18 18   Temp: 98 F (36.7 C) 98.4 F (36.9 C) 99.1 F (37.3 C) 99.3 F (37.4 C)  TempSrc:  Oral Oral Oral  SpO2: 97% 96% 98% 91%  Weight: 238 lb 4.8 oz (108.1 kg)     Height: 5\' 11"  (1.803 m)      Complete ROS negative except as per subjective. Physical Exam  Constitutional: He is oriented to person, place, and time. He appears well-developed and well-nourished. No distress.  HENT:  Head: Normocephalic and atraumatic.  Eyes: Right eye exhibits no discharge. Left eye exhibits no discharge. No scleral icterus.  Cardiovascular: Normal rate and regular rhythm.   Pulmonary/Chest: Effort normal and breath sounds normal. No respiratory distress. He has no wheezes.  Abdominal: Soft. Bowel sounds are normal. He exhibits distension. He exhibits no mass. There is no tenderness. There is no rebound and no guarding. No hernia.  Musculoskeletal: He exhibits no edema, tenderness or deformity.  Neurological: He is alert and oriented to person, place, and time.  Skin: Skin is warm and dry. Capillary refill takes 2 to 3 seconds. He is not diaphoretic.  Psychiatric: He has a normal mood and affect. His behavior is normal. Judgment and thought content normal.     Assessment/Plan:  Active Problems:   SBO (small bowel obstruction) (HCC)   COPD (chronic obstructive pulmonary disease) (HCC)   Essential hypertension   Atrial fibrillation (HCC)  SBO: -surgery on board, evaluating with gastrografin being given  this AM, scan at 1400. -Pain control with dilaudid 1mg  q4 PRN, one does at 0100 -NG tube for decompression in place, will evaluate again for continued need -labs are unremarkable   -Will await surgery recommendations   COPD: -Albuterol inhaler 1 puff q6 -Will continue to monitor -Please notify physician if SpO2 drops to 88% on room air or if patient requires O2 supplementation  Essential Hypertension: -will continue to monitor, if pressures consistently greater than 779 systolic page MD -currently holding home dose of metoprolol 25mg  BID  Atrial fibrillation -s/p ablation without known reoccurrance -notify physician if patient develops lightheadedness, palpitations or similar concerns  EtOH abuse: -CIWA protocol with ativan 1mg  ordered  Diet: NPO DVT ppx: heparin injection 5000 units subcutaneous q8hrs Fluids: 125cc/hr CODE: full Dispo: Anticipated discharge in approximately 0-1 day(s).   Kathi Ludwig, MD 04/24/2017, 11:14 AM Pager: Pager# [phone]

## 2017-04-24 NOTE — Progress Notes (Signed)
    CC:  SBO  Subjective: He feels better this AM some flatus and 2 BM, abdomen still feels pretty distended. SB protocol is in progress  Objective: Vital signs in last 24 hours: Temp:  [97.9 F (36.6 C)-99.3 F (37.4 C)] 99.3 F (37.4 C) (07/25 0528) Pulse Rate:  [63-100] 100 (07/25 0528) Resp:  [16-18] 18 (07/25 0528) BP: (126-172)/(66-105) 152/66 (07/25 0528) SpO2:  [89 %-98 %] 91 % (07/25 0528) Weight:  [108.1 kg (238 lb 4.8 oz)-111.1 kg (245 lb)] 108.1 kg (238 lb 4.8 oz) (07/24 1739) Last BM Date: 04/22/17 NPO 1127 IV 355 urine 900 from NG Afebrile   Intake/Output from previous day: 07/24 0701 - 07/25 0700 In: 1127.1 [I.V.:1127.1] Out: 5790 [Urine:355; Emesis/NG output:900] Intake/Output this shift: No intake/output data recorded.  General appearance: alert, cooperative and no distress Resp: clear to auscultation bilaterally GI: large abdomen , still distended, few hyperactive BS, + flatus/BM x 2   Lab Results:   Recent Labs  04/23/17 1002 04/24/17 0445  WBC 6.8 8.3  HGB 15.1 14.4  HCT 43.0 43.2  PLT 166 161    BMET  Recent Labs  04/23/17 1002 04/24/17 0445  NA 135 137  K 4.6 4.4  CL 100* 103  CO2 27 24  GLUCOSE 121* 99  BUN 19 15  CREATININE 1.02 1.01  CALCIUM 9.3 8.6*   PT/INR No results for input(s): LABPROT, INR in the last 72 hours.   Recent Labs Lab 04/23/17 1002  AST 24  ALT 16*  ALKPHOS 83  BILITOT 1.3*  PROT 7.0  ALBUMIN 3.9     Lipase     Component Value Date/Time   LIPASE 37 04/23/2017 1002     Medications: . folic acid  1 mg Oral Daily  . heparin  5,000 Units Subcutaneous Q8H  . mouth rinse  15 mL Mouth Rinse BID  . multivitamin with minerals  1 tablet Oral Daily  . thiamine  100 mg Oral Daily   Or  . thiamine  100 mg Intravenous Daily   Anti-infectives    None      Assessment/Plan Recurret SBO vs ileus Multiple abdominal surgeries COPD HTN History of colon cancer History of A. fib now in  NSR FEN:  IV fluids/NPO ID:  None DVT:  SCD/heparin   Plan:  Small Bowel protocol, he got the contrast 0524 this AM.  Film around 2 PM.    LOS: 1 day    Seth Soto 04/24/2017 [phone]

## 2017-04-24 NOTE — Progress Notes (Signed)
  Date: 04/24/2017  Patient name: Deago Burruss  Medical record number: [id number]  Date of birth: 15-Jan-1941   I have seen and evaluated Remo Lipps Reiswig and discussed their care with the Residency Team. In brief, patient is a 76 year old male with a past medical history of hypertension, COPD, atrial fibrillation, colon cancer status post surgical resection complicated by multiple incisional hernia repairs and 2 previous small bowel obstructions who presented to the ED with sudden onset abdominal pain 1 day.  Yesterday morning patient was woken up from sleep with diffuse abdominal pain which is colicky in nature, nonradiating with no aggravating or relieving factors. Patient states that pain progressively increased in intensity and duration of episodes and became sharp in nature. Patient also complained of one episode of diarrhea earlier in the evening. No melena, no hematochezia. Patient came to the ED for further follow-up as this pain was similar to his previous episodes of SBO. Since admission, patient has had 2 bowel movements and passes gas. He still complains of some mild abdominal distention. No fevers or chills, no chest pain, no nausea or vomiting, no lightheadedness or syncope, no headache, no focal weakness, no chest pain, shortness of breath, no palpitations.  Patient feels well today with no abdominal pain but would like to go home when possible.  PMHx, Fam Hx, and/or Soc Hx : As per resident admit note  Vitals:   04/23/17 2142 04/24/17 0528  BP: (!) 126/95 (!) 152/66  Pulse: 96 100  Resp: 18 18  Temp: 99.1 F (37.3 C) 99.3 F (37.4 C)   Gen.: Awake alert and oriented 3, NAD CVS: Regular rate and rhythm, normal heart sounds Lungs: CTA bilaterally Abdomen: Soft, distended, normoactive bowel sounds, nontender Extremities: No edema noted  Assessment and Plan: I have seen and evaluated the patient as outlined above. I agree with the formulated Assessment and Plan as detailed in the  residents' note, with the following changes:   1. 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 Since admission patient has had 2 bowel movements and continues to pass gas and states his symptoms have resolved. - Surgery follow-up and recommendations appreciated - Patient had an NG tube placed in the ED which has put out approximately 900 mL. He is scheduled for small bowel for occult today and had Gastrografin already. He expects to get his abdominal film around 2 PM. We will follow-up these results. - Likely advance his diet today depending on results of his small bowel protocol. If he continues to do well and tolerates an oral diet he will likely be discharged home later today. - No further workup for now   Aldine Contes, MD 7/25/201812:30 PM

## 2017-04-24 NOTE — Progress Notes (Signed)
Film this afternoon shows contrast in the colon:   Contrast is now noted within the colon and rectum. No dilated bowel loops are identified on this study. An NG tube is present with tip overlying the mid stomach. Left hip arthroplasty changes noted.  Will d/c NG and start clears.

## 2017-04-24 NOTE — Plan of Care (Signed)
Problem: Safety: Goal: Ability to remain free from injury will improve Outcome: Progressing Pt will be free from falls and injuries during this hospitalization.  Problem: Bowel/Gastric: Goal: Will not experience complications related to bowel motility Outcome: Progressing Pt's signs and symptoms of small bowel obstruction will resolve prior to discharge.

## 2017-04-25 LAB — GLUCOSE, CAPILLARY
GLUCOSE-CAPILLARY: 84 mg/dL (ref 65–99)
Glucose-Capillary: 112 mg/dL — ABNORMAL HIGH (ref 65–99)

## 2017-04-25 MED ORDER — ONDANSETRON 4 MG PO TBDP: 4.0000 mg | ORAL_TABLET | Freq: Three times a day (TID) | ORAL | Status: AC | PRN

## 2017-04-25 NOTE — Progress Notes (Signed)
    CC:   SBO  Subjective: He is much better, on full liquids now and plans for soft diet later today.  Home later today if he does well.   Objective: Vital signs in last 24 hours: Temp:  [98.5 F (36.9 C)-99.4 F (37.4 C)] 99.3 F (37.4 C) (07/26 0512) Pulse Rate:  [74-102] 83 (07/26 0512) Resp:  [16-17] 17 (07/26 0512) BP: (135-178)/(71-91) 143/71 (07/26 0512) SpO2:  [90 %-92 %] 91 % (07/26 0512) Last BM Date: 04/24/17 270 PO 2300 IV Voided x 4 NG 400 - removed last PM BM x 6 Intake/Output from previous day: 07/25 0701 - 07/26 0700 In: 2580.4 [P.O.:270; I.V.:2310.4] Out: 400 [Emesis/NG output:400] Intake/Output this shift: No intake/output data recorded.  General appearance: alert, cooperative and no distress  Large abdomen, up eating second breakfast, + BS and BM  Lab Results:   Recent Labs  04/23/17 1002 04/24/17 0445  WBC 6.8 8.3  HGB 15.1 14.4  HCT 43.0 43.2  PLT 166 161    BMET  Recent Labs  04/23/17 1002 04/24/17 0445  NA 135 137  K 4.6 4.4  CL 100* 103  CO2 27 24  GLUCOSE 121* 99  BUN 19 15  CREATININE 1.02 1.01  CALCIUM 9.3 8.6*   PT/INR No results for input(s): LABPROT, INR in the last 72 hours.   Recent Labs Lab 04/23/17 1002  AST 24  ALT 16*  ALKPHOS 83  BILITOT 1.3*  PROT 7.0  ALBUMIN 3.9     Lipase     Component Value Date/Time   LIPASE 37 04/23/2017 1002     Medications: . folic acid  1 mg Oral Daily  . heparin  5,000 Units Subcutaneous Q8H  . mouth rinse  15 mL Mouth Rinse BID  . metoprolol tartrate  25 mg Oral BID  . multivitamin with minerals  1 tablet Oral Daily  . thiamine  100 mg Oral Daily   Or  . thiamine  100 mg Intravenous Daily    Assessment/Plan Recurret SBO vs ileus Multiple abdominal surgeries COPD HTN History of colon cancer History of A. fib now in NSR FEN:  IV fluids/NPO ID:  None DVT:  SCD/heparin   Plan:  Advance diet and home when tolerating soft diet.    LOS: 2 days     Dylon Correa 04/25/2017 [phone]

## 2017-04-25 NOTE — Discharge Instructions (Signed)
Please include a copy of the discharge summary with the patient's discharge paperwork so that he may distribute a copy to his PCP.    Small Bowel Obstruction A small bowel obstruction means that something is blocking the small bowel. The small bowel is also called the small intestine. It is the long tube that connects the stomach to the colon. An obstruction will stop food and fluids from passing through the small bowel. Treatment depends on what is causing the problem and how bad the problem is. Follow these instructions at home:  Get a lot of rest.  Follow your diet as told by your doctor. You may need to: ? Only drink clear liquids until you start to get better. ? Avoid solid foods as told by your doctor.  Take over-the-counter and prescription medicines only as told by your doctor.  Keep all follow-up visits as told by your doctor. This is important. Contact a doctor if:  You have a fever.  You have chills. Get help right away if:  You have pain or cramps that get worse.  You throw up (vomit) blood.  You have a feeling of being sick to your stomach (nausea) that does not go away.  You cannot stop throwing up.  You cannot drink fluids.  You feel confused.  You feel dry or thirsty (dehydrated).  Your belly gets more bloated.  You feel weak or you pass out (faint). This information is not intended to replace advice given to you by your health care provider. Make sure you discuss any questions you have with your health care provider. Document Released: 10/25/2004 Document Revised: 05/14/2016 Document Reviewed: 11/11/2014 Elsevier Interactive Patient Education  [employer].

## 2017-04-25 NOTE — Progress Notes (Signed)
Patient discharged with wife at bedside.  Upon removing IV saline lock  After applying gauze to site, bleeding occurred with  Ecchymosis. Pressure applied for 5 mins with clean gauze and bleeding stopped.  Ecchymosis area  marked with green marker and extra gauze and tape given to wife. Wife verberalized understanding  How to apply tape if needed.

## 2017-04-25 NOTE — Discharge Summary (Signed)
Name: Seth Soto MRN: [id number] DOB: 06/05/1941 76 y.o. PCP: System, Pcp Not In  Date of Admission: 04/23/2017 10:04 AM Date of Discharge: 04/25/2017 Attending Physician: Aldine Contes, MD  Discharge Diagnosis: Active Problems:   SBO (small bowel obstruction) (HCC)   COPD (chronic obstructive pulmonary disease) ([place])   Essential hypertension   Atrial fibrillation [facility])  Discharge Medications: Allergies as of 04/25/2017      Reactions   Latex Other (See Comments)   "peels skin off"      Medication List    TAKE these medications   albuterol 108 (90 Base) MCG/ACT inhaler Commonly known as:  PROVENTIL HFA;VENTOLIN HFA Inhale 1 puff into the lungs every 6 (six) hours as needed for wheezing or shortness of breath.   betamethasone dipropionate 0.05 % lotion Apply 1 application topically 3 (three) times a week.   metoprolol tartrate 25 MG tablet Commonly known as:  LOPRESSOR Take 25 mg by mouth 2 (two) times daily.   OTEZLA 30 MG Tabs Generic drug:  Apremilast Take 30 mg by mouth 2 (two) times daily.   tetrahydrozoline-zinc 0.05-0.25 % ophthalmic solution Commonly known as:  VISINE-AC Place 3-4 drops into both eyes every morning.       Disposition and follow-up:   Mr.Seth Soto was discharged from [facility] in Good condition.  At the hospital follow up visit please address:  1.  Abdominal pain and regular bowel movements, discuss alcohol reduction, dietary guidelines for reducing the incidence of SBO.  2.  Labs / imaging needed at time of follow-up: n/a  3.  Pending labs/ test needing follow-up: n/a  Follow-up Appointments:   Hospital Course by problem list: Active Problems:   SBO (small bowel obstruction) (HCC)   COPD (chronic obstructive pulmonary disease) (HCC)   Essential hypertension   Atrial fibrillation (HCC)   Small bowel obstruction:  [name] is a pleasant 76 year old male who presented to the ED with sudden onset dull  colicky abdominal pain that initiated around 0200 and progressed throughout the morning to sharp stabbing severe pain prompting his desire to seek assistance at the local ED. [place] as such previous medical records were not available in great quantity. We ascertained that he had a previous medical history significant for COPD, A-fib s/p ablation, colon cancer s/p removal accompanied by septic shock and dialysis for 6 months in 2005. He underwent incisional hernia repair and multiple revisions of that repair as well. Upon presentation he informed the staff that he was having a small bowel obstruction as he had twice had before.  A nasogastric tube(NG) was placed for decompression, he was made nothing per os(NPO), with his pain controled by dilaudid which he required only once each day of his star. He experienced a bowel movement the night of his stay as well as notable flatulence. A gastrografin study was initiated the second day of admission which determined that there was not obvious sign of an obstruction. As such the patient was progressed to clear liquids that evening and the NG tube was removed. The following morning he was progressed to a liquid diet and tolerated that well. During the noon meal he was permitted soft foods and discharged home after tolerating those as well. He is advised to follow-up with his primary care doctor (PCP) as an outpatient for dietary guidance surrounding small bowel obstruction.   A-fib There was no reoccurrence of his atrial fibrillation during his stay. He remained free  of palpitations and lightheadedness.   HTN: Patients blood pressures where mildly elevated during his stay. Following his promotion to permissive PO intake he was restarted on metoprolol which surprisingly reduced his BP to 140's/90's. He remained in that range until discharge.   COPD: Patient remained free of SOB, cough did not worsen and he was treated with his  home dose of albuterol.  Prophylaxis of potential deep vein thrombosis was provided with heparin.   Discharge Vitals:   BP (!) 160/80 (BP Location: Left Arm)   Pulse 83   Temp 99.8 F (37.7 C) (Oral)   Resp 18   Ht 5\' 11"  (1.803 m)   Wt 238 lb 4.8 oz (108.1 kg)   SpO2 94%   BMI 33.24 kg/m   Pertinent Labs, Studies, and Procedures:  CMP Latest Ref Rng & Units 04/24/2017 04/23/2017  Glucose 65 - 99 mg/dL 99 121(H)  BUN 6 - 20 mg/dL 15 19  Creatinine 0.61 - 1.24 mg/dL 1.01 1.02  Sodium 135 - 145 mmol/L 137 135  Potassium 3.5 - 5.1 mmol/L 4.4 4.6  Chloride 101 - 111 mmol/L 103 100(L)  CO2 22 - 32 mmol/L 24 27  Calcium 8.9 - 10.3 mg/dL 8.6(L) 9.3  Total Protein 6.5 - 8.1 g/dL - 7.0  Total Bilirubin 0.3 - 1.2 mg/dL - 1.3(H)  Alkaline Phos 38 - 126 U/L - 83  AST 15 - 41 U/L - 24  ALT 17 - 63 U/L - 16(L)   CBC Latest Ref Rng & Units 04/24/2017 04/23/2017  WBC 4.0 - 10.5 K/uL 8.3 6.8  Hemoglobin 13.0 - 17.0 g/dL 14.4 15.1  Hematocrit 39.0 - 52.0 % 43.2 43.0  Platelets 150 - 400 K/uL 161 166   Abdominal X-ray with gastrografin: PORTABLE ABDOMEN - 1 VIEW COMPARISON:  04/23/2017 abdominal CT FINDINGS: Contrast is now noted within the colon and rectum. No dilated bowel loops are identified on this study. An NG tube is present with tip overlying the mid stomach. Left hip arthroplasty changes noted. IMPRESSION: Contrast present within the colon and rectum -no evidence of high-grade bowel obstruction. NG tube with tip overlying the mid stomach.  Discharge Instructions: Discharge Instructions    Diet - low sodium heart healthy    Complete by:  As directed    Increase activity slowly    Complete by:  As directed      Please include a copy of the discharge summary with the patient discharge paperwork so that he may distribute a copy to his PCP.   SignedIna Homes, MD 04/25/2017, 3:30 PM   Pager# [phone]

## 2017-04-25 NOTE — Progress Notes (Signed)
   Subjective: Seth Soto was sitting up in bed eating grits and ice cream upon entering the room. He stated that he felt well, had over 7 bowel movements and was anxious to go home. He denied chest pain, nausea, vomiting, diarrhea, constipation, headache, visual disturbances or alteration in his mentation. He was in agreement with going home today.   Objective:  Vital signs in last 24 hours: Vitals:   04/24/17 0528 04/24/17 1300 04/24/17 2204 04/25/17 0512  BP: (!) 152/66 (!) 178/91 135/76 (!) 143/71  Pulse: 100 (!) 102 74 83  Resp: 18  16 17   Temp: 99.3 F (37.4 C) 99.4 F (37.4 C) 98.5 F (36.9 C) 99.3 F (37.4 C)  TempSrc: Oral Oral Oral Oral  SpO2: 91% 92% 90% 91%  Weight:      Height:       Complete ROS negative except as per subjective.   Physical Exam  Constitutional: He is oriented to person, place, and time. He appears well-developed and well-nourished. No distress.  HENT:  Head: Normocephalic and atraumatic.  Eyes: EOM are normal. Right eye exhibits no discharge. Left eye exhibits no discharge. No scleral icterus.  Neck: Normal range of motion. Neck supple.  Cardiovascular: Normal rate, regular rhythm and normal heart sounds.   No murmur heard. Pulmonary/Chest: Effort normal and breath sounds normal. No respiratory distress. He has no wheezes.  Abdominal: Soft. Bowel sounds are normal. He exhibits distension. He exhibits no mass. There is no tenderness. There is no rebound and no guarding.  Musculoskeletal: Normal range of motion. He exhibits no edema or tenderness.  Neurological: He is alert and oriented to person, place, and time.  Skin: Skin is warm and dry. Capillary refill takes less than 2 seconds. He is not diaphoretic.  Psychiatric: He has a normal mood and affect. His behavior is normal. Thought content normal.   Assessment/Plan:  Active Problems:   SBO (small bowel obstruction) (HCC)   COPD (chronic obstructive pulmonary disease) (HCC)   Essential  hypertension   Atrial fibrillation (HCC)  SBO: -Abdominal gastrografin study did not demonstrate observable obstruction  -Pain control with dilaudid 1mg  q4 PRN, has not requested since 0111, 04/24/17 -NG tube removed -Surgery progressed patient to clear liquid diet -progressed to liquid this morning, will attempt soft diet for noon meal -We continue to appreciate their assistance and input  COPD: -Albuterol inhaler 1 puff q6 -Will continue to monitor -Please notify physician if SpO2 drops to 88% on room air or if patient requires O2 supplementation  Essential Hypertension: -will continue to monitor, if pressures consistently greater than 940 systolic page MD -currently holding home dose of metoprolol 25mg  BID  Atrial fibrillation -s/p ablation without known reoccurrance -notify physician if patient develops lightheadedness, palpitations or similar concerns  EtOH abuse: -CIWA protocol with ativan 1mg  ordered  Code: Full Diet: Soft Fluids: n/a Dispo: Anticipated discharge in approximately today(s).   Kathi Ludwig, MD 04/25/2017, 7:06 AM Pager: Pager# [phone]

## 2017-04-25 NOTE — Progress Notes (Signed)
Internal Medicine Attending:   I saw and examined the patient. I reviewed the resident's note and I agree with the resident's findings and plan as documented in the resident's note.  Patient feels well today with no new complaints. He is tolerating oral intake and has had bowel movements overnight and is passing gas. Patient was initially admitted with worsening abdominal pain secondary to partial small bowel obstruction versus ileus. He has improved clinically and repeat imaging done yesterday showed no evidence of obstruction. Surgery follow-up and recommendations appreciated. No further workup at this time. Patient stable for discharge home today. Will need outpatient follow-up with his PCP.

## 2018-10-16 IMAGING — DX DG ABD PORTABLE 1V
1 series · 1 of 1 positions shown · non-contrast
Comparison: Chest x-ray of April 23, 2017 at 0040 hours.

CLINICAL DATA: Status post repositioning of nasogastric tube.

EXAM:
PORTABLE ABDOMEN - 1 VIEW

[abdomen kub]
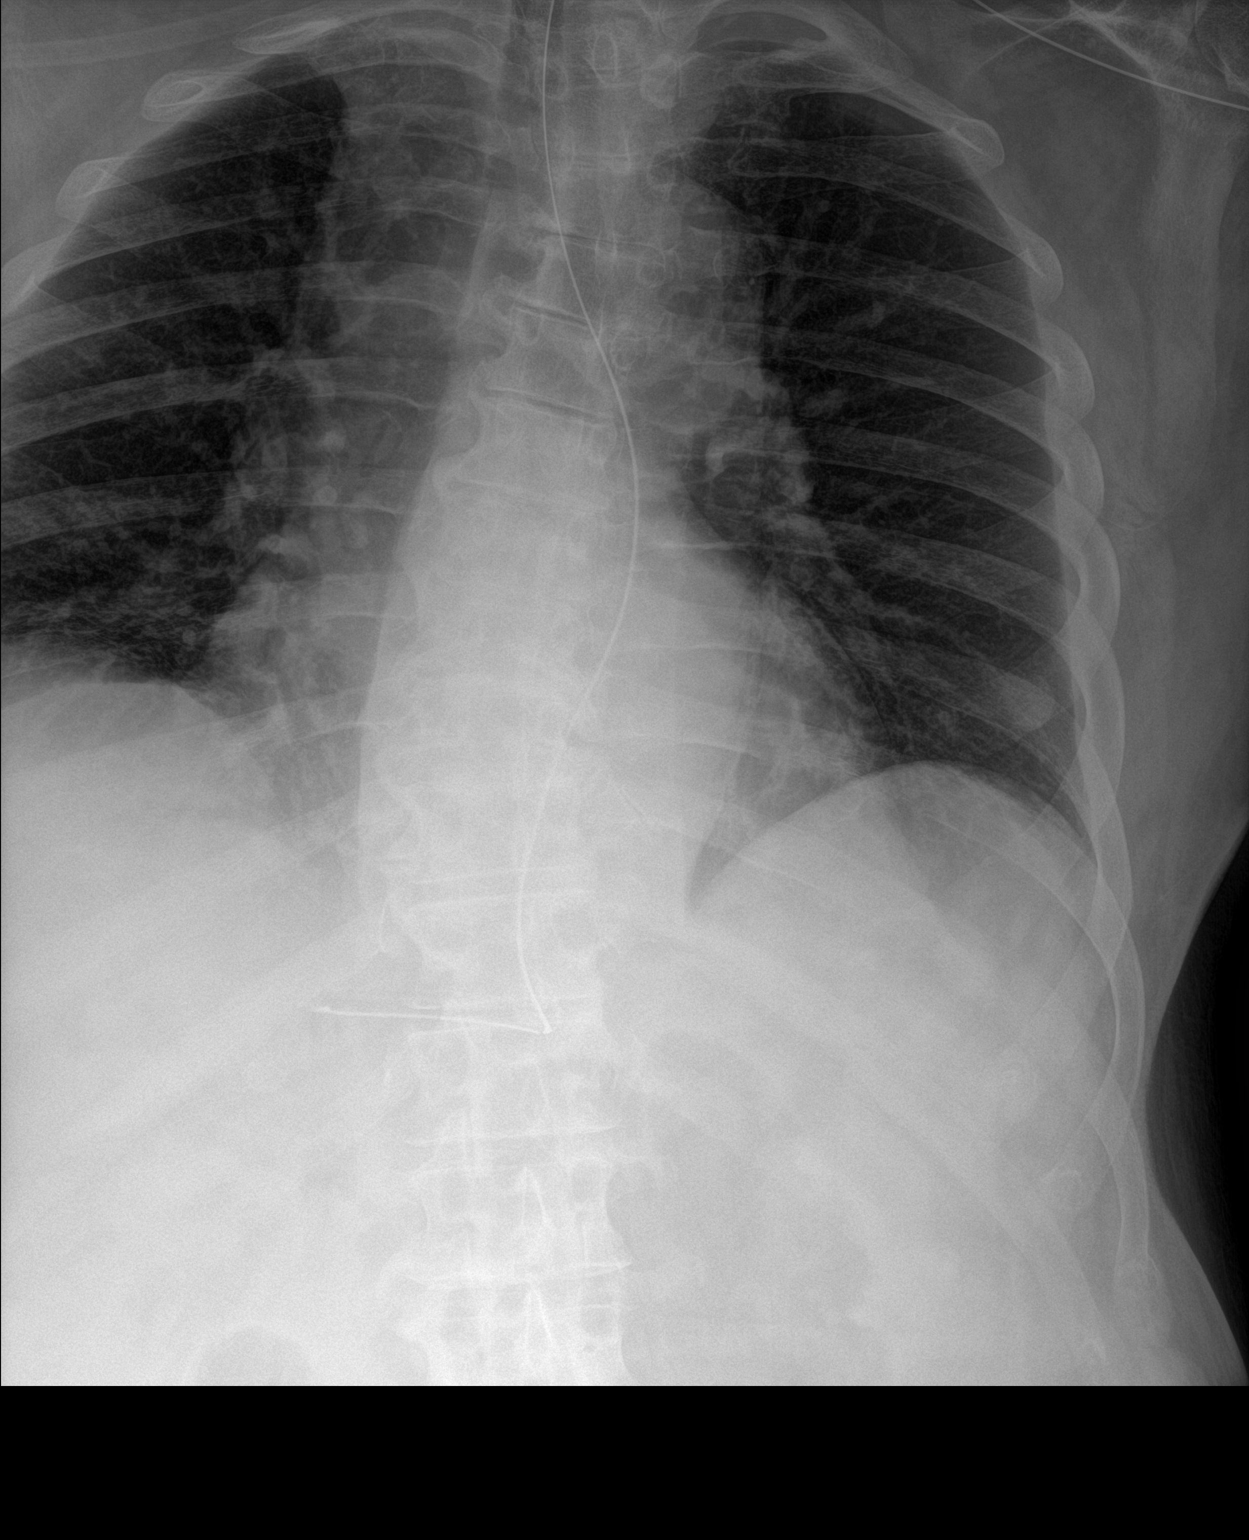

[1 of 1 positions shown; findings below may reference images not displayed]

FINDINGS: The esophagogastric tube has been advanced somewhat sense of the
proximal port lies at the GE junction with the tip in the proximal
gastric body.
IMPRESSION: Interval mild advancement of the esophagogastric tube. Advancement
by an additional 10 cm would be useful to assure that the proximal
port remains below the GE junction.

## 2018-10-16 IMAGING — DX DG ABD PORTABLE 1V
1 series · 1 of 1 positions shown · non-contrast
Comparison: CT abdomen and pelvis 04/23/2017

CLINICAL DATA: Nasogastric tube placement, abdominal distension,
small-bowel obstruction

EXAM:
PORTABLE ABDOMEN - 1 VIEW

[abdomen kub]
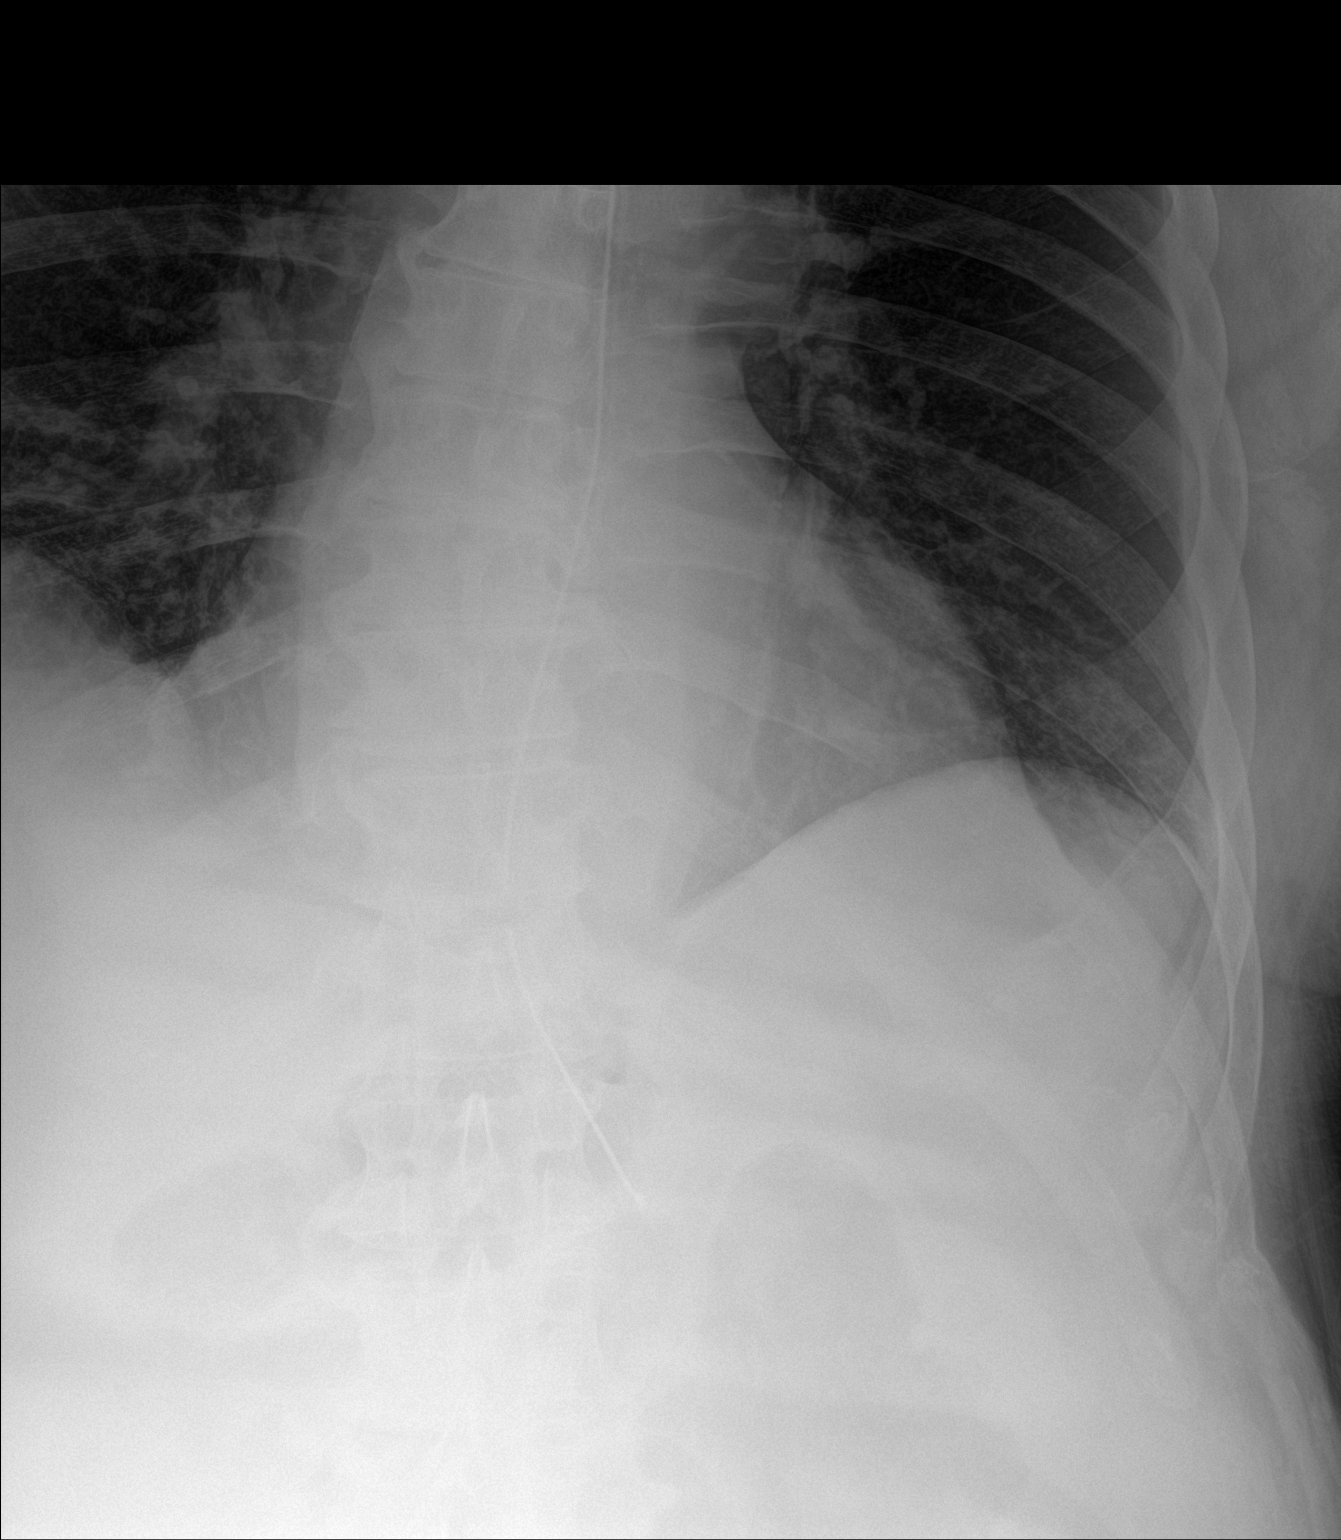

[1 of 1 positions shown; findings below may reference images not displayed]

FINDINGS: Tip of endotracheal tube projects over stomach though the proximal
side-port projects over the distal esophagus, recommend advancing
tube 7 cm.

Lung bases clear.

Visualized bowel gas pattern normal.
IMPRESSION: Recommend advancing nasogastric tube 7 cm to place the proximal
side-port within the stomach.

## 2018-10-16 IMAGING — CT CT ABD-PELV W/O CM
2 of 4 series · 15 of 46 positions shown, 17 images · non-contrast
Comparison: None.

CLINICAL DATA: Abdominal pain. Personal history small bowel
obstruction.

EXAM:
CT ABDOMEN AND PELVIS WITHOUT CONTRAST
TECHNIQUE: Multidetector CT imaging of the abdomen and pelvis was performed
following the standard protocol without IV contrast.

[Series 4: abd/ pelvis 5.0 i30f 2 · axial · 0.94mm/px · z∈[+915,+1355]mm · 12 of 96 slices shown, 14 images]
[im 4/96  soft-tissue]
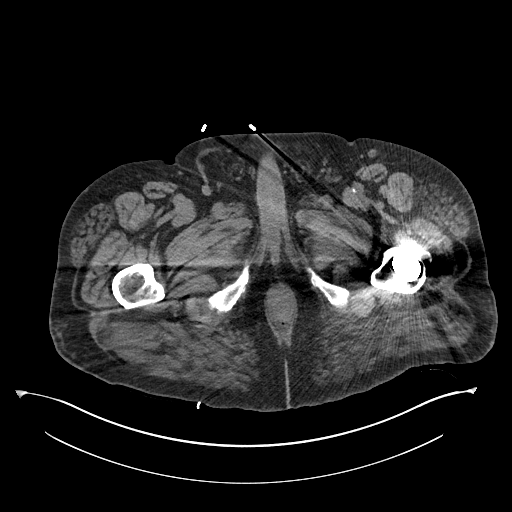
[im 4/96  bone]
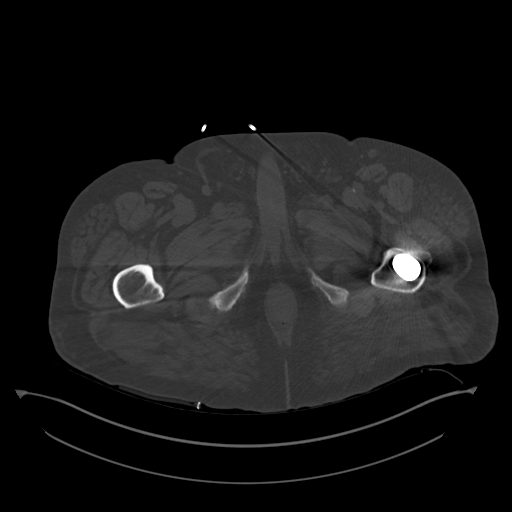
[im 12/96  soft-tissue]
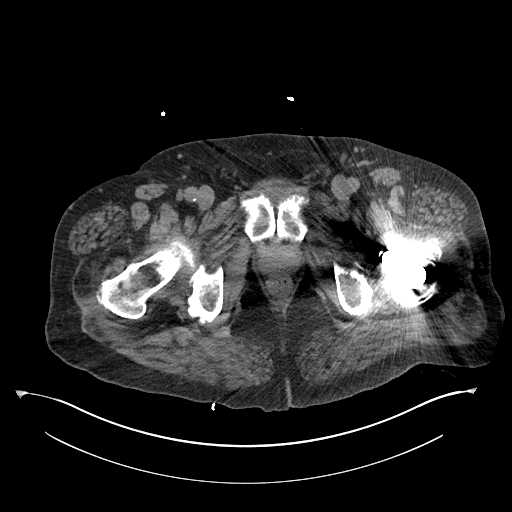
[im 20/96  soft-tissue]
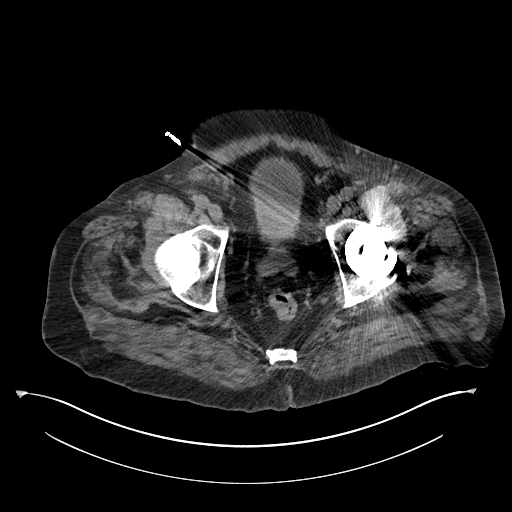
[im 28/96  soft-tissue]
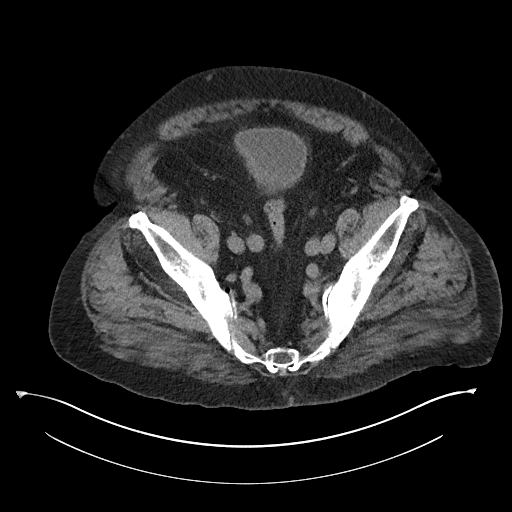
[im 36/96  soft-tissue]
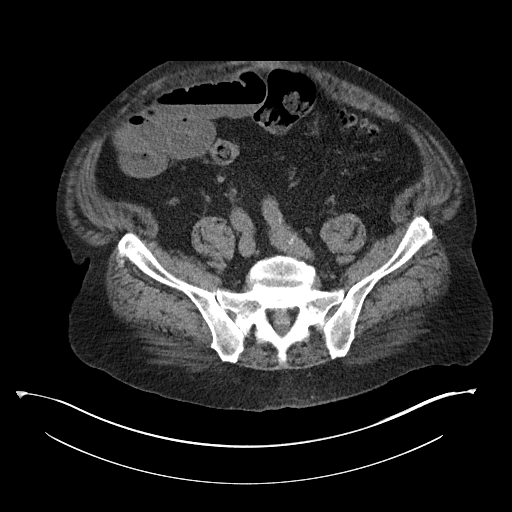
[im 44/96  soft-tissue]
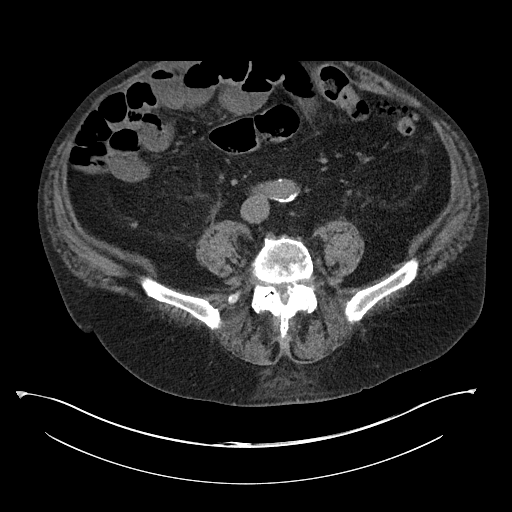
[im 52/96  soft-tissue]
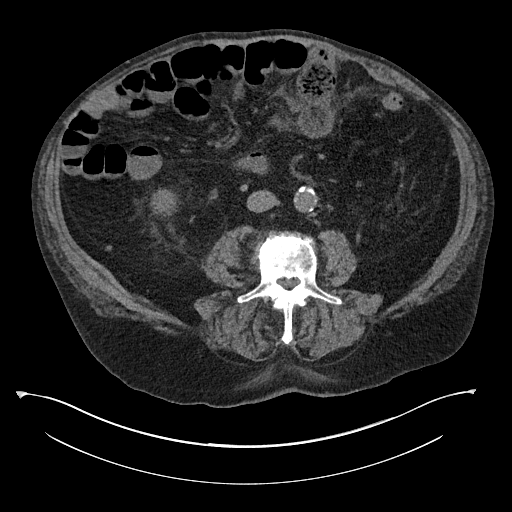
[im 60/96  soft-tissue]
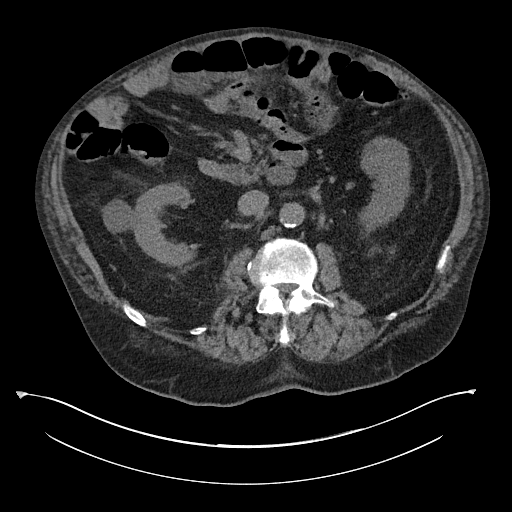
[im 68/96  soft-tissue]
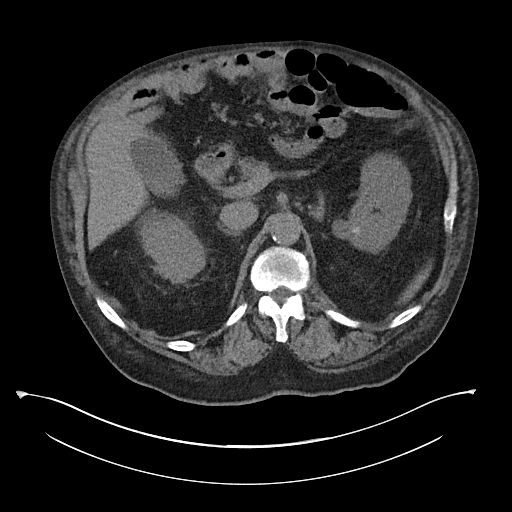
[im 68/96  bone]
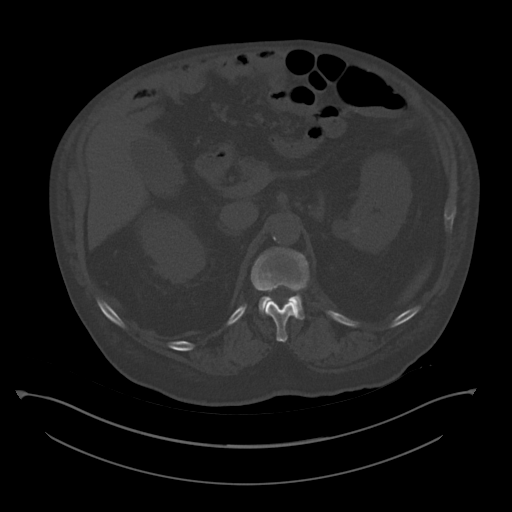
[im 76/96  soft-tissue]
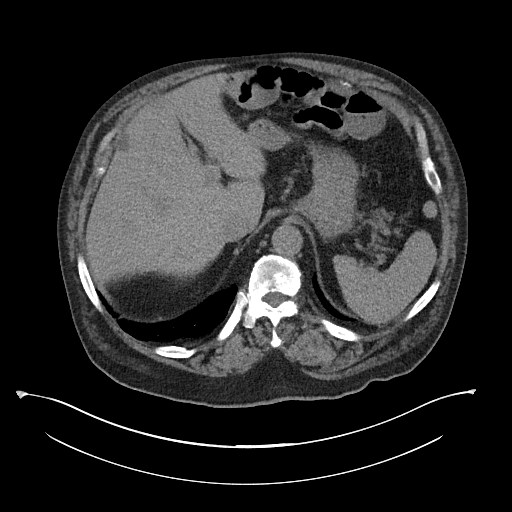
[im 84/96  soft-tissue]
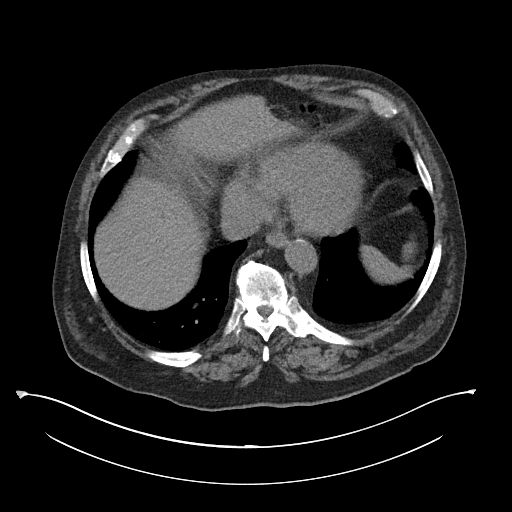
[im 92/96  soft-tissue]
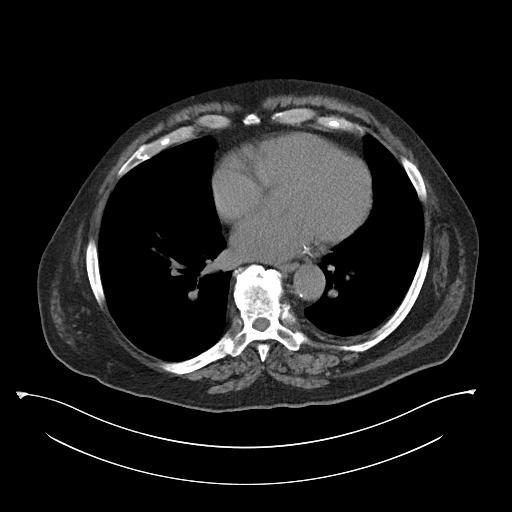

[Series 7: cor st · coronal · 0.92mm/px · 3 of 119 slices shown]
[im 40/119  soft-tissue]
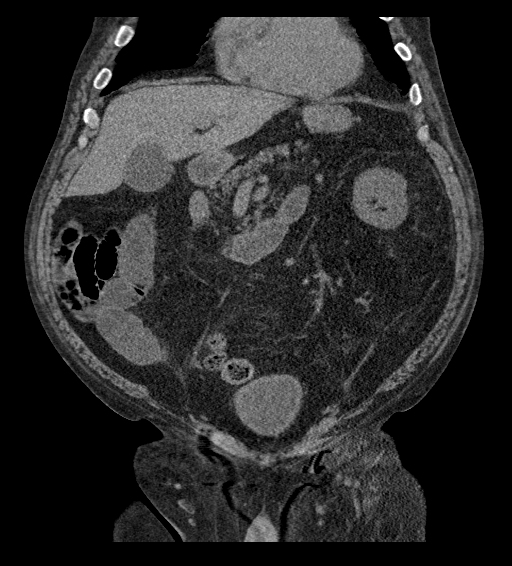
[im 53/119  soft-tissue]
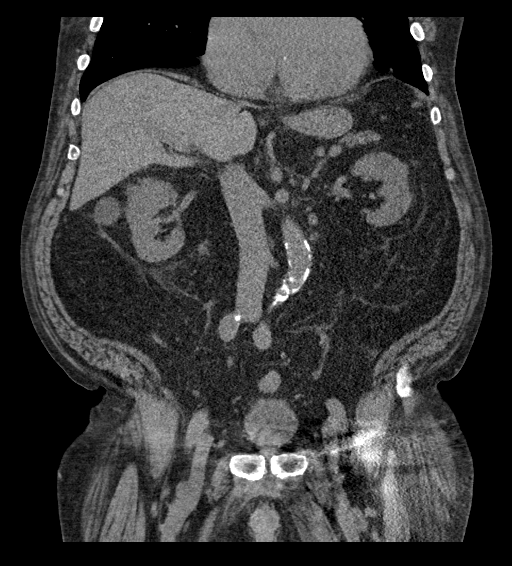
[im 66/119  soft-tissue]
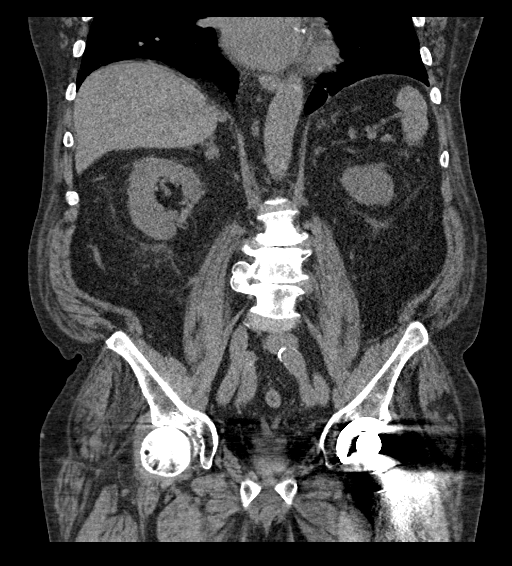

[15 of 46 positions shown; findings below may reference images not displayed]

FINDINGS: Lower chest: Mild dependent atelectasis is present bilaterally. No
focal nodule, mass, or airspace disease is present. Coronary artery
calcifications are present. The the heart is otherwise within normal
limits. No significant pleural or pericardial effusion is present.

Hepatobiliary: A 1.4 cm fluid density lesion is present within the
left lobe of the liver anteriorly. No other focal hepatic lesions
are present. The common bile duct is within normal limits. Fluid
layering in the gallbladder likely represents sludge.

Pancreas: Unremarkable. No pancreatic ductal dilatation or
surrounding inflammatory changes.

Spleen: Normal in size without focal abnormality. 2 splenules are
noted.

Adrenals/Urinary Tract: Calcification is noted in the right adrenal
gland. The adrenal glands are otherwise within normal limits
bilaterally. A 3 cm water density exophytic lesion of the right
kidney likely represents a simple cyst. A 2 cm posteromedial
exophytic low-density lesion in the left kidney likely represents a
simple cyst. A 9 mm indeterminate lesion is present near the upper
pole of the left kidney.

No stone or hydronephrosis is present in either kidney. The ureters
are within normal limits. The urinary bladder is unremarkable.

Stomach/Bowel: The stomach and duodenum are within normal limits,
decompressed. Multiple small bowel adhesions are present. Anterior
loops in the right mid and lower abdomen are mildly stenting,
measured up to 3.5 cm. Fluid levels are present. No discrete
transition segment is evident. Diverticular changes are present in
the descending colon without focal inflammation to suggest
diverticulitis. Right hemicolectomy is noted. The anastomosis is
intact. There is some stranding about the small bowel. No free fluid
or free air is present.

Vascular/Lymphatic: Atherosclerotic calcifications are present in
the aorta without aneurysm. There is no significant adenopathy.

Reproductive: Prostate is unremarkable.

Other: No significant free fluid or free air is present.

Musculoskeletal: Levoconvex curvature of the lumbar spine is noted.
Asymmetric endplate changes are present on the right. AP alignment
is anatomic. No focal lytic or blastic lesions are present.
Degenerative changes are noted in the SI joints bilaterally. Left
total hip arthroplasty is present. The right hip is within normal
limits.
IMPRESSION: 1. Distended loops of small bowel in the right mid and lower abdomen
with fluid levels suggesting ileus or partial small bowel
obstruction.
2. Inflammatory changes near the ileocolonic anastomosis may reflect
a transition point.
3. Right hemicolectomy.
4. Inflammatory changes about both kidneys without significant stone
or obstruction.
5. Bilateral exophytic renal lesions appear to be cystic.

## 2023-06-02 DEATH — deceased
